# Patient Record
Sex: Male | Born: 1995 | Race: Black or African American | Hispanic: No | Marital: Single | Smoking: Never smoker
Health system: Southern US, Community
[De-identification: ages and names within clinical notes are randomized; demographics above are authoritative.]

## PROBLEM LIST (undated history)

## (undated) ENCOUNTER — Ambulatory Visit: Payer: Self-pay | Source: Home / Self Care

---
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
## 2018-01-08 ENCOUNTER — Other Ambulatory Visit: Payer: Self-pay

## 2018-01-08 ENCOUNTER — Ambulatory Visit (INDEPENDENT_AMBULATORY_CARE_PROVIDER_SITE_OTHER): Payer: Self-pay

## 2018-01-08 ENCOUNTER — Encounter (HOSPITAL_COMMUNITY): Payer: Self-pay | Admitting: Emergency Medicine

## 2018-01-08 ENCOUNTER — Ambulatory Visit (HOSPITAL_COMMUNITY)
Admission: EM | Admit: 2018-01-08 | Discharge: 2018-01-08 | Disposition: A | Discharge status: Home / Self Care | Payer: Self-pay | Attending: Family Medicine | Admitting: Family Medicine

## 2018-01-08 ENCOUNTER — Emergency Department (HOSPITAL_COMMUNITY)
Admission: EM | Admit: 2018-01-08 | Discharge: 2018-01-08 | Disposition: A | Discharge status: Home / Self Care | Payer: Self-pay

## 2018-01-08 DIAGNOSIS — M79644 Pain in right finger(s): Secondary | ICD-10-CM

## 2018-01-08 MED ORDER — MELOXICAM 7.5 MG PO TABS: 7.5000 mg | ORAL_TABLET | Freq: Every day | ORAL | 0 refills | Status: DC

## 2018-01-08 NOTE — Discharge Instructions (Signed)
Xray negative for fracture or dislocation. Given no redness/increased warmth, less worried about infection. Start mobic as directed, ice compress, elevation. Monitor for any spreading redness, increased warmth, fever, follow up for reevaluation.

## 2018-01-08 NOTE — ED Triage Notes (Signed)
Pt with swelling and pain to right middle finger

## 2018-01-08 NOTE — ED Provider Notes (Signed)
[facility]    CSN: [id number] Arrival date & time: 01/08/18  1250     History   Chief Complaint Chief Complaint  Patient presents with  . Finger Injury    HPI Charles Hensley is a 22 y.o. male.   22 year old male comes in for 4-day history of right middle finger swelling and pain.  Patient states he woke up with the pain, no specific injury.  Pain at the DIP.  No erythema, increased warmth.  Denies fever, chills, night sweats.  Denies open wound.  Has been doing warm compresses with temporary improvement.  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     History reviewed. No pertinent past medical history.  There are no active problems to display for this patient.   History reviewed. No pertinent surgical history.     Home Medications    Prior to Admission medications   Medication Sig Start Date End Date Taking? Authorizing Provider  Aspirin-Acetaminophen-Caffeine (GOODY HEADACHE PO) Take 1 packet by mouth daily as needed (for pain.).    [provider]  clotrimazole (LOTRIMIN) 1 % cream Apply to affected area 3 times daily.  Use for 2 days after resolution of rash. 02/17/13   Lowanda Foster, NP  meloxicam (MOBIC) 7.5 MG tablet Take 1 tablet (7.5 mg total) by mouth daily. 01/08/18   Belinda Fisher, PA-C    Family History History reviewed. No pertinent family history.  Social History Social History   Tobacco Use  . Smoking status: Not on file  Substance Use Topics  . Alcohol use: Not on file  . Drug use: Not on file     Allergies   Patient has no known allergies.   Review of Systems Review of Systems  Reason unable to perform ROS: See HPI as above.     Physical Exam Triage Vital Signs ED Triage Vitals [01/08/18 1327]  Enc Vitals Group     BP 138/79     Pulse Rate (!) 55     Resp 18     Temp 98 F (36.7 C)     Temp Source Oral     SpO2 95 %     Weight      Height      Head  Circumference      Peak Flow      Pain Score      Pain Loc      Pain Edu?      Excl. in GC?    No data found.  Updated Vital Signs BP 138/79 (BP Location: Left Arm)   Pulse (!) 55   Temp 98 F (36.7 C) (Oral)   Resp 18   SpO2 95%   Physical Exam  Constitutional: He is oriented to person, place, and time. He appears well-developed and well-nourished. No distress.  HENT:  Head: Normocephalic and atraumatic.  Eyes: Pupils are equal, round, and reactive to light. Conjunctivae are normal.  Musculoskeletal:  See picture below.  Mild swelling to the right middle finger pad without erythema or increased warmth.  Tenderness to palpation of the right middle  DIP.  Full range of motion.  Sensation intact and equal bilaterally. Radial pulse 2+. Cap refill less than 2 seconds.  Neurological: He is alert and oriented to person, place, and time.       UC Treatments / Results  Labs (all labs ordered are listed, but only abnormal results are displayed) Labs Reviewed -  No data to display  EKG None Radiology Dg Finger Middle Right  Result Date: 01/08/2018 CLINICAL DATA:  Pain and swelling of the right middle finger beginning 4 days ago. EXAM: RIGHT MIDDLE FINGER 2+V COMPARISON:  None. FINDINGS: There is no evidence of fracture or dislocation. There is no evidence of arthropathy or other focal bone abnormality. Soft tissues are unremarkable. IMPRESSION: Negative. Electronically Signed   By: Marnee SpringJonathon  Watts M.D.   On: 01/08/2018 14:18    Procedures Procedures (including critical care time)  Medications Ordered in UC Medications - No data to display   Initial Impression / Assessment and Plan / UC Course  I have reviewed the triage vital signs and the nursing notes.  Pertinent labs & imaging results that were available during my care of the patient were reviewed by me and considered in my medical decision making (see chart for details).    Xray negative. Given patient without  erythema, increased warmth, low suspicion of felon/paronychia at this time. Suspect inflammatory cause of symptoms. Will start mobic, ice compress, elevation. Return precautions given. Patient expresses understanding and agrees to plan.  Case discussed with Dr Tracie HarrierHagler, who agrees to plan.   Final Clinical Impressions(s) / UC Diagnoses   Final diagnoses:  Pain of right middle finger    ED Discharge Orders        Ordered    meloxicam (MOBIC) 7.5 MG tablet  Daily     01/08/18 1430       Belinda FisherYu, Amy V, [place] JerseyPA-C 01/08/18 1549

## 2018-01-10 ENCOUNTER — Emergency Department (HOSPITAL_COMMUNITY)
Admission: EM | Admit: 2018-01-10 | Discharge: 2018-01-10 | Disposition: A | Discharge status: Home / Self Care | Payer: Self-pay | Attending: Physician Assistant | Admitting: Physician Assistant

## 2018-01-10 ENCOUNTER — Encounter (HOSPITAL_COMMUNITY): Payer: Self-pay

## 2018-01-10 DIAGNOSIS — L03011 Cellulitis of right finger: Secondary | ICD-10-CM | POA: Insufficient documentation

## 2018-01-10 MED ORDER — HYDROCODONE-ACETAMINOPHEN 5-325 MG PO TABS: 1.0000 | ORAL_TABLET | Freq: Four times a day (QID) | ORAL | 0 refills | Status: DC | PRN

## 2018-01-10 MED ORDER — CEPHALEXIN 500 MG PO CAPS: 500.0000 mg | ORAL_CAPSULE | Freq: Four times a day (QID) | ORAL | 0 refills | Status: AC

## 2018-01-10 MED ORDER — LIDOCAINE HCL (PF) 1 % IJ SOLN
10.0000 mL | Freq: Once | INTRAMUSCULAR | Status: AC
  Administered 2018-01-10: 10 mL
  Filled 2018-01-10: qty 10

## 2018-01-10 NOTE — ED Provider Notes (Signed)
[facility] EMERGENCY DEPARTMENT Provider Note   CSN: [id number] Arrival date & time: 01/10/18  2106     History   Chief Complaint Chief Complaint  Patient presents with  . Hand Pain    HPI Charles Hensley is a 22 y.o. male who is previously healthy who presents with a 1 week history of R middle finger pain.  He reports going to urgent care 2 days ago and had a negative x-ray.  He was told to keep an eye on it and that there could be an infection developing.  He denies any known injury.  He has had some tingling.  The pain is worse in the tip.  He has had progressive pain and swelling over the past few days.  He denies any fevers.  There is no open wound.  He denies any drainage.  He has been taking ibuprofen 800 mg at home without relief.  HPI  History reviewed. No pertinent past medical history.  There are no active problems to display for this patient.   History reviewed. No pertinent surgical history.      Home Medications    Prior to Admission medications   Medication Sig Start Date End Date Taking? Authorizing Provider  Aspirin-Acetaminophen-Caffeine (GOODY HEADACHE PO) Take 1 packet by mouth daily as needed (for pain.).    [provider]  cephALEXin (KEFLEX) 500 MG capsule Take 1 capsule (500 mg total) by mouth 4 (four) times daily for 7 days. 01/10/18 01/17/18  Porshia Blizzard, Waylan Boga, PA-C  clotrimazole (LOTRIMIN) 1 % cream Apply to affected area 3 times daily.  Use for 2 days after resolution of rash. 02/17/13   Lowanda Foster, NP  HYDROcodone-acetaminophen (NORCO/VICODIN) 5-325 MG tablet Take 1-2 tablets by mouth every 6 (six) hours as needed for severe pain. 01/10/18   Ziggy Chanthavong, Waylan Boga, PA-C  meloxicam (MOBIC) 7.5 MG tablet Take 1 tablet (7.5 mg total) by mouth daily. 01/08/18   Belinda Fisher, PA-C    Family History No family history on file.  Social History Social History   Tobacco Use  . Smoking status: Not on file  Substance Use Topics  .  Alcohol use: Not on file  . Drug use: Not on file     Allergies   Patient has no known allergies.   Review of Systems Review of Systems  Constitutional: Negative for fever.  Musculoskeletal: Positive for joint swelling (Tip of R middle finger).  Neurological: Positive for numbness (paresthesia).     Physical Exam Updated Vital Signs BP 133/72 (BP Location: Right Arm)   Pulse 74   Temp 99 F (37.2 C) (Oral)   Resp 13   Ht 5\' 10"  (1.778 m)   Wt 95.3 kg (210 lb)   SpO2 98%   BMI 30.13 kg/m   Physical Exam  Constitutional: He appears well-developed and well-nourished. No distress.  HENT:  Head: Normocephalic and atraumatic.  Eyes: Pupils are equal, round, and reactive to light. Conjunctivae are normal. Right eye exhibits no discharge. Left eye exhibits no discharge. No scleral icterus.  Neck: Normal range of motion. Neck supple. No thyromegaly present.  Cardiovascular: Normal rate, regular rhythm and normal heart sounds. Exam reveals no gallop and no friction rub.  No murmur heard. Pulmonary/Chest: Effort normal and breath sounds normal. No stridor. No respiratory distress. He has no wheezes. He has no rales.  Abdominal: Soft. Bowel sounds are normal. He exhibits no distension. There is no tenderness. There is no rebound and no  guarding.  Musculoskeletal: He exhibits no edema.  Edema noted to palmar aspect distal to the DIP on right long finger, significant tenderness, some induration, sensation intact; range of motion intact of the digit, no tenderness of PIP or DIP, no tenderness, edema, or drainage noted to the eponychial space  Lymphadenopathy:    He has no cervical adenopathy.  Neurological: He is alert. Coordination normal.  Skin: Skin is warm and dry. No rash noted. He is not diaphoretic. No pallor.  Psychiatric: He has a normal mood and affect.  Nursing note and vitals reviewed.        ED Treatments / Results  Labs (all labs ordered are listed, but only  abnormal results are displayed) Labs Reviewed - No data to display  EKG None  Radiology No results found.  Procedures .Marland KitchenIncision and Drainage Date/Time: 01/11/2018 12:55 AM Performed by: Emi Holes, PA-C Authorized by: Emi Holes, PA-C   Consent:    Consent obtained:  Verbal   Consent given by:  Patient   Risks discussed:  Bleeding and incomplete drainage Location:    Indications for incision and drainage: felon.   Location:  Upper extremity   Upper extremity location:  Finger   Finger location:  R long finger Pre-procedure details:    Skin preparation:  Chloraprep Anesthesia (see MAR for exact dosages):    Anesthesia method:  Nerve block   Block location:  Digital   Block needle gauge:  25 G   Block anesthetic:  Lidocaine 1% w/o epi   Block technique:  Intrathecal   Block injection procedure:  Anatomic landmarks identified, introduced needle, negative aspiration for blood and incremental injection   Block outcome:  Anesthesia achieved Procedure type:    Complexity:  Simple Procedure details:    Incision types:  Single straight   Incision depth:  Subcutaneous   Scalpel blade:  11   Wound management:  Probed and deloculated   Drainage:  Bloody   Drainage amount:  Moderate   Wound treatment:  Wound left open   Packing materials:  None Post-procedure details:    Patient tolerance of procedure:  Tolerated well, no immediate complications Comments:     Under the supervision of my attending, Dr. Corlis Leak.   (including critical care time)  Medications Ordered in ED Medications  lidocaine (PF) (XYLOCAINE) 1 % injection 10 mL (10 mLs Infiltration Given 01/10/18 2227)     Initial Impression / Assessment and Plan / ED Course  I have reviewed the triage vital signs and the nursing notes.  Pertinent labs & imaging results that were available during my care of the patient were reviewed by me and considered in my medical decision making (see chart for  details).     Patient with suspected felon to right long finger.  Incision and drainage conducted under the supervision of my attending, Dr. Corlis Leak, without purulent drainage.  Pressure dressing applied.  Will discharge home with Keflex, short course of pain medication.  I reviewed the [place] narcotic database and found no discrepancies.  Patient to follow-up with hand surgery for further management.  Return precautions discussed.  Patient understands and agrees with plan.  Patient vitals stable throughout ED course and discharged in satisfactory condition.  Patient also evaluated by Dr. Corlis Leak who guided the patient's management and agrees with plan.  Final Clinical Impressions(s) / ED Diagnoses   Final diagnoses:  Felon of finger of right hand    ED Discharge Orders  Ordered    cephALEXin (KEFLEX) 500 MG capsule  4 times daily     01/10/18 2311    HYDROcodone-acetaminophen (NORCO/VICODIN) 5-325 MG tablet  Every 6 hours PRN     01/10/18 2313       Emi HolesLaw, Morgyn Marut M, PA-C 01/11/18 0058    Abelino DerrickMackuen, Courteney Lyn, MD 01/13/18 1300

## 2018-01-10 NOTE — Discharge Instructions (Addendum)
Take Keflex as prescribed until completed for told to stop by hand doctor, Dr. Melvyn Novasrtmann.  Take ibuprofen as prescribed over-the-counter as needed for pain.  For severe pain, take 1-2 Norco every 4-6 hours as needed.  Please follow-up with Dr. Melvyn Novasrtmann in the next few days for recheck and further evaluation and treatment of your finger.  Please return to emergency department if you develop any new or worsening symptoms including increasing pain, redness, swelling, red streaking moving up your finger and hand, or fevers.  Do not drink alcohol, drive, operate machinery or participate in any other potentially dangerous activities while taking opiate pain medication as it may make you sleepy. Do not take this medication with any other sedating medications, either prescription or over-the-counter. If you were prescribed Percocet or Vicodin, do not take these with acetaminophen (Tylenol) as it is already contained within these medications and overdose of Tylenol is dangerous.   This medication is an opiate (or narcotic) pain medication and can be habit forming.  Use it as little as possible to achieve adequate pain control.  Do not use or use it with extreme caution if you have a history of opiate abuse or dependence. This medication is intended for your use only - do not give any to anyone else and keep it in a secure place where nobody else, especially children, have access to it. It will also cause or worsen constipation, so you may want to consider taking an over-the-counter stool softener while you are taking this medication.

## 2018-01-10 NOTE — ED Triage Notes (Signed)
Reports swelling to R middle finger for the past week, denies injury, [facility]. reports worse swelling at the tip

## 2019-04-17 ENCOUNTER — Ambulatory Visit (HOSPITAL_COMMUNITY)
Admission: EM | Admit: 2019-04-17 | Discharge: 2019-04-17 | Disposition: A | Discharge status: Home / Self Care | Payer: Self-pay | Attending: Family Medicine | Admitting: Family Medicine

## 2019-04-17 ENCOUNTER — Encounter (HOSPITAL_COMMUNITY): Payer: Self-pay | Admitting: Emergency Medicine

## 2019-04-17 ENCOUNTER — Ambulatory Visit (INDEPENDENT_AMBULATORY_CARE_PROVIDER_SITE_OTHER): Payer: Self-pay

## 2019-04-17 ENCOUNTER — Other Ambulatory Visit: Payer: Self-pay

## 2019-04-17 DIAGNOSIS — S93402A Sprain of unspecified ligament of left ankle, initial encounter: Secondary | ICD-10-CM

## 2019-04-17 DIAGNOSIS — M25572 Pain in left ankle and joints of left foot: Secondary | ICD-10-CM

## 2019-04-17 DIAGNOSIS — X501XXA Overexertion from prolonged static or awkward postures, initial encounter: Secondary | ICD-10-CM

## 2019-04-17 DIAGNOSIS — Y9367 Activity, basketball: Secondary | ICD-10-CM

## 2019-04-17 MED ORDER — IBUPROFEN 800 MG PO TABS: 800.0000 mg | ORAL_TABLET | Freq: Three times a day (TID) | ORAL | 0 refills | Status: DC

## 2019-04-17 NOTE — ED Triage Notes (Signed)
Pt states on Monday he was playing basketball and he rolled his L ankle. Pt states "I just wanted an xray just to be sure nothing is broken". Pt states he heard a pop. Ambulatory with steady gait.

## 2019-04-17 NOTE — ED Provider Notes (Signed)
[facility]    CSN: [id number] Arrival date & time: 04/17/19  1143     History   Chief Complaint Chief Complaint  Patient presents with   Ankle Pain    HPI Oswaldo Cueto is a 23 y.o. male no significant past medical history presenting today for evaluation of left ankle injury.  Patient was playing basketball on Monday and twisted his left ankle.  Since he has had pain and swelling.  Pain has gradually improved since then over the past 2 to 3 days.  He is here today as he wishes to get an x-ray as he felt a pop when it happened and wants to make sure everything looks okay.  Overall minimal pain today.  Denies numbness or tingling.  Denies previous injury to this foot besides previous sprains.  HPI  History reviewed. No pertinent past medical history.  There are no active problems to display for this patient.   History reviewed. No pertinent surgical history.     Home Medications    Prior to Admission medications   Medication Sig Start Date End Date Taking? Authorizing Provider  ibuprofen (ADVIL) 800 MG tablet Take 1 tablet (800 mg total) by mouth 3 (three) times daily. 04/17/19   Homar Weinkauf, Elesa Hacker, PA-C    Family History No family history on file.  Social History Social History   Tobacco Use   Smoking status: Not on file  Substance Use Topics   Alcohol use: Not on file   Drug use: Not on file     Allergies   Patient has no known allergies.   Review of Systems Review of Systems  Constitutional: Negative for fatigue and fever.  Eyes: Negative for redness, itching and visual disturbance.  Respiratory: Negative for shortness of breath.   Cardiovascular: Negative for chest pain and leg swelling.  Gastrointestinal: Negative for nausea and vomiting.  Musculoskeletal: Positive for arthralgias. Negative for myalgias.  Skin: Negative for color change, rash and wound.  Neurological: Negative for dizziness, syncope, weakness, light-headedness and  headaches.     Physical Exam Triage Vital Signs ED Triage Vitals  Enc Vitals Group     BP 04/17/19 1314 (!) 119/55     Pulse Rate 04/17/19 1314 (!) 48     Resp 04/17/19 1314 16     Temp 04/17/19 1314 98.2 F (36.8 C)     Temp src --      SpO2 04/17/19 1314 100 %     Weight --      Height --      Head Circumference --      Peak Flow --      Pain Score 04/17/19 1316 0     Pain Loc --      Pain Edu? --      Excl. in [place]? --    No data found.  Updated Vital Signs BP (!) 119/55    Pulse (!) 48    Temp 98.2 F (36.8 C)    Resp 16    SpO2 100%   Visual Acuity Right Eye Distance:   Left Eye Distance:   Bilateral Distance:    Right Eye Near:   Left Eye Near:    Bilateral Near:     Physical Exam Vitals signs and nursing note reviewed.  Constitutional:      Appearance: He is well-developed.     Comments: No acute distress  HENT:     Head: Normocephalic and atraumatic.     Nose: Nose  normal.  Eyes:     Conjunctiva/sclera: Conjunctivae normal.  Neck:     Musculoskeletal: Neck supple.  Cardiovascular:     Rate and Rhythm: Normal rate.  Pulmonary:     Effort: Pulmonary effort is normal. No respiratory distress.  Abdominal:     General: There is no distension.  Musculoskeletal: Normal range of motion.     Comments: Left ankle: No obvious swelling deformity or discoloration, nontender to palpation over medial lateral malleolus, nontender throughout dorsum of foot Dorsalis pedis 2+, cap refill less than 2 seconds Able to wiggle toes  Skin:    General: Skin is warm and dry.  Neurological:     Mental Status: He is alert and oriented to person, place, and time.      UC Treatments / Results  Labs (all labs ordered are listed, but only abnormal results are displayed) Labs Reviewed - No data to display  EKG   Radiology Dg Ankle Complete Left  Result Date: 04/17/2019 CLINICAL DATA:  Left ankle pain and swelling, twisting injury. EXAM: LEFT ANKLE COMPLETE - 3+  VIEW COMPARISON:  None. FINDINGS: No acute fracture or malalignment. Ankle mortise is congruent. Joint spaces are maintained. Corticated ossific density at the dorsal aspect of the talonavicular joint, likely sequela of remote trauma. Tiny enthesophyte at the Achilles tendon insertion. Mild prominence of the soft tissues overlying the lateral malleolus. IMPRESSION: No acute osseous abnormality, left ankle. Electronically Signed   By: Duanne GuessNicholas  Plundo M.D.   On: 04/17/2019 14:07    Procedures Procedures (including critical care time)  Medications Ordered in UC Medications - No data to display  Initial Impression / Assessment and Plan / UC Course  I have reviewed the triage vital signs and the nursing notes.  Pertinent labs & imaging results that were available during my care of the patient were reviewed by me and considered in my medical decision making (see chart for details).     Obtained x-ray per patient request.  X-ray negative.  Most likely sprain.  Will apply Ace wrap, treat with anti-inflammatories.  Continue to monitor for gradual resolution.Discussed strict return precautions. Patient verbalized understanding and is agreeable with plan.  Final Clinical Impressions(s) / UC Diagnoses   Final diagnoses:  Sprain of left ankle, unspecified ligament, initial encounter     Discharge Instructions     Use anti-inflammatories for pain/swelling. You may take up to 800 mg Ibuprofen every 8 hours with food. You may supplement Ibuprofen with Tylenol [phone] mg every 8 hours.      ED Prescriptions    Medication Sig Dispense Auth. Provider   ibuprofen (ADVIL) 800 MG tablet Take 1 tablet (800 mg total) by mouth 3 (three) times daily. 21 tablet Kamar Callender, AssariaHallie C, PA-C     Controlled Substance Prescriptions [place] Controlled Substance Registry consulted? Not Applicable   Lew DawesWieters, Siriyah Ambrosius C, [place] JerseyPA-C 04/17/19 1445

## 2019-04-17 NOTE — Discharge Instructions (Signed)
Use anti-inflammatories for pain/swelling. You may take up to 800 mg Ibuprofen every 8 hours with food. You may supplement Ibuprofen with Tylenol 500-1000 mg every 8 hours.   

## 2019-12-22 IMAGING — DX DG FINGER MIDDLE 2+V*R*
3 series · 3 of 3 positions shown · non-contrast
Comparison: None.

CLINICAL DATA: Pain and swelling of the right middle finger
beginning 4 days ago.

EXAM:
RIGHT MIDDLE FINGER 2+V

[finger ap]
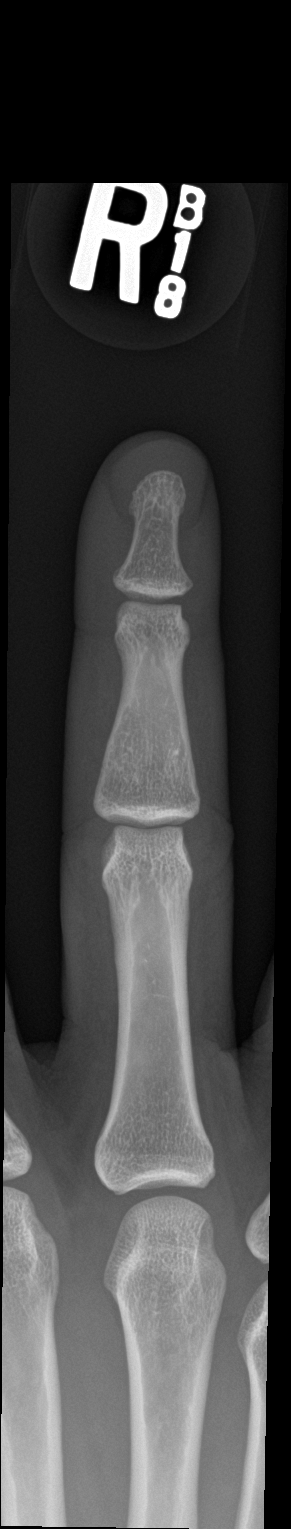

[finger obl]
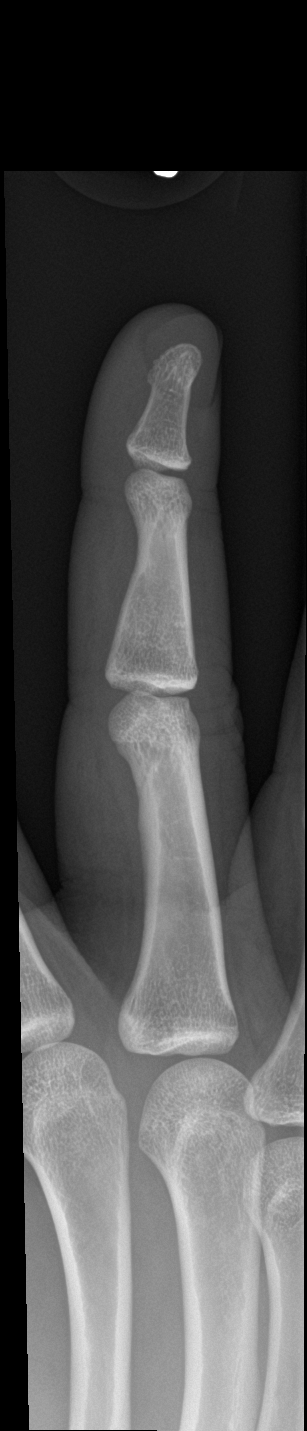

[finger lat]
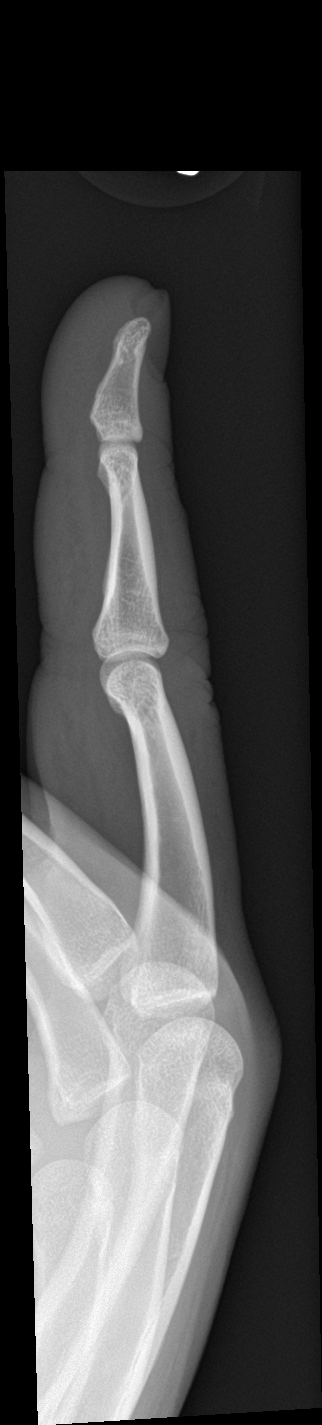

[3 of 3 positions shown; findings below may reference images not displayed]

FINDINGS: There is no evidence of fracture or dislocation. There is no
evidence of arthropathy or other focal bone abnormality. Soft
tissues are unremarkable.
IMPRESSION: Negative.

## 2021-09-15 ENCOUNTER — Encounter: Payer: Self-pay | Admitting: Emergency Medicine

## 2021-09-15 ENCOUNTER — Other Ambulatory Visit: Payer: Self-pay

## 2021-09-15 ENCOUNTER — Ambulatory Visit
Admission: EM | Admit: 2021-09-15 | Discharge: 2021-09-15 | Disposition: A | Discharge status: Home / Self Care | Payer: Self-pay | Attending: Physician Assistant | Admitting: Physician Assistant

## 2021-09-15 DIAGNOSIS — L0291 Cutaneous abscess, unspecified: Secondary | ICD-10-CM

## 2021-09-15 MED ORDER — DOXYCYCLINE HYCLATE 100 MG PO CAPS: 100.0000 mg | ORAL_CAPSULE | Freq: Two times a day (BID) | ORAL | 0 refills | Status: DC

## 2021-09-15 NOTE — ED Provider Notes (Signed)
[facility]    CSN: [id number] Arrival date & time: 09/15/21  1123      History   Chief Complaint Chief Complaint  Patient presents with   Abscess    HPI Charles Hensley is a 25 y.o. male.   Patient is here today for evaluation of abscess to his lower back that he first noticed about a week and a half ago.  He reports that area is tender to touch.  He states a few days ago he did notice some bloody purulent drainage, he cleaned the area with peroxide and kept it bandaged.  He has not had fever or chills.  He denies any nausea or vomiting.  The history is provided by the patient.  Abscess Associated symptoms: no fever, no nausea and no vomiting    History reviewed. No pertinent past medical history.  There are no problems to display for this patient.   History reviewed. No pertinent surgical history.     Home Medications    Prior to Admission medications   Medication Sig Start Date End Date Taking? Authorizing Provider  doxycycline (VIBRAMYCIN) 100 MG capsule Take 1 capsule (100 mg total) by mouth 2 (two) times daily. 09/15/21  Yes Tomi Bamberger, PA-C  ibuprofen (ADVIL) 800 MG tablet Take 1 tablet (800 mg total) by mouth 3 (three) times daily. 04/17/19   Wieters, Junius Creamer, PA-C    Family History History reviewed. No pertinent family history.  Social History Social History   Tobacco Use   Smoking status: Unknown     Allergies   Patient has no known allergies.   Review of Systems Review of Systems  Constitutional:  Negative for chills and fever.  Eyes:  Negative for discharge and redness.  Respiratory:  Negative for shortness of breath.   Gastrointestinal:  Negative for nausea and vomiting.  Skin:  Positive for color change and wound.    Physical Exam Triage Vital Signs ED Triage Vitals  Enc Vitals Group     BP 09/15/21 1148 115/67     Pulse Rate 09/15/21 1148 61     Resp 09/15/21 1148 16     Temp 09/15/21 1148 98.2 F (36.8 C)      Temp Source 09/15/21 1148 Oral     SpO2 09/15/21 1148 99 %     Weight --      Height --      Head Circumference --      Peak Flow --      Pain Score 09/15/21 1149 7     Pain Loc --      Pain Edu? --      Excl. in [place]? --    No data found.  Updated Vital Signs BP 115/67 (BP Location: Left Arm)    Pulse 61    Temp 98.2 F (36.8 C) (Oral)    Resp 16    SpO2 99%       Physical Exam Vitals and nursing note reviewed.  Constitutional:      General: He is not in acute distress.    Appearance: Normal appearance. He is not ill-appearing, toxic-appearing or diaphoretic.  HENT:     Head: Normocephalic and atraumatic.  Eyes:     Conjunctiva/sclera: Conjunctivae normal.  Cardiovascular:     Rate and Rhythm: Normal rate.  Pulmonary:     Effort: Pulmonary effort is normal.  Skin:    Comments: Approximately 4 cm area of induration noted to right sided lower back with mild  erythema associated.  No fluctuance noted.  Central wound noted with no active drainage or bleeding.  Neurological:     Mental Status: He is alert.  Psychiatric:        Mood and Affect: Mood normal.        Behavior: Behavior normal.     UC Treatments / Results  Labs (all labs ordered are listed, but only abnormal results are displayed) Labs Reviewed - No data to display  EKG   Radiology No results found.  Procedures Procedures (including critical care time)  Medications Ordered in UC Medications - No data to display  Initial Impression / Assessment and Plan / UC Course  I have reviewed the triage vital signs and the nursing notes.  Pertinent labs & imaging results that were available during my care of the patient were reviewed by me and considered in my medical decision making (see chart for details).    Doxycycline prescribed for suspected abscess and recommended warm compresses to help promote continued spontaneous drainage.  No indication for I&D today given drainage has already started without  intervention.  Encouraged follow-up if symptoms do not improve or if they worsen in any way.  Final Clinical Impressions(s) / UC Diagnoses   Final diagnoses:  Abscess   Discharge Instructions   None    ED Prescriptions     Medication Sig Dispense Auth. Provider   doxycycline (VIBRAMYCIN) 100 MG capsule Take 1 capsule (100 mg total) by mouth 2 (two) times daily. 20 capsule Tomi Bamberger, PA-C      PDMP not reviewed this encounter.   Tomi Bamberger, PA-C 09/15/21 1226

## 2021-09-15 NOTE — ED Triage Notes (Signed)
Abscess on lower back, swollen, red, tender x 1.5 weeks. States he works out a lot and it rubs against the seat. Hx of abscess under arm that went away on its own.

## 2024-02-20 ENCOUNTER — Ambulatory Visit
Admission: EM | Admit: 2024-02-20 | Discharge: 2024-02-20 | Disposition: A | Discharge status: Home / Self Care | Payer: Self-pay | Attending: Family Medicine | Admitting: Family Medicine

## 2024-02-20 DIAGNOSIS — L03031 Cellulitis of right toe: Secondary | ICD-10-CM

## 2024-02-20 MED ORDER — CEPHALEXIN 500 MG PO CAPS: 500.0000 mg | ORAL_CAPSULE | Freq: Three times a day (TID) | ORAL | 0 refills | Status: AC

## 2024-02-20 NOTE — ED Provider Notes (Signed)
 [facility]    CSN: [id number] Arrival date & time: 02/20/24  1253      History   Chief Complaint Chief Complaint  Patient presents with   Toe Pain    HPI Charles Hensley is a 28 y.o. male.    Toe Pain  Right big toe pain.  Is been bothering him for about 2 days.  It is a little swollen and red.  No known injury.   NKDA  History reviewed. No pertinent past medical history.  There are no active problems to display for this patient.   History reviewed. No pertinent surgical history.     Home Medications    Prior to Admission medications   Medication Sig Start Date End Date Taking? Authorizing Provider  cephALEXin  (KEFLEX ) 500 MG capsule Take 1 capsule (500 mg total) by mouth 3 (three) times daily for 7 days. 02/20/24 02/27/24 Yes Ann Keto, MD    Family History History reviewed. No pertinent family history.  Social History Social History   Tobacco Use   Smoking status: Never   Smokeless tobacco: Never  Vaping Use   Vaping status: Never Used  Substance Use Topics   Alcohol use: Not Currently   Drug use: Not Currently     Allergies   Patient has no known allergies.   Review of Systems Review of Systems   Physical Exam Triage Vital Signs ED Triage Vitals  Encounter Vitals Group     BP 02/20/24 1307 105/65     Systolic BP Percentile --      Diastolic BP Percentile --      Pulse Rate 02/20/24 1307 63     Resp 02/20/24 1307 18     Temp 02/20/24 1307 97.9 F (36.6 C)     Temp Source 02/20/24 1307 Oral     SpO2 02/20/24 1307 97 %     Weight 02/20/24 1305 187 lb (84.8 kg)     Height 02/20/24 1305 5\' 10"  (1.778 m)     Head Circumference --      Peak Flow --      Pain Score 02/20/24 1301 7     Pain Loc --      Pain Education --      Exclude from Growth Chart --    No data found.  Updated Vital Signs BP 105/65 (BP Location: Left Arm)   Pulse 63   Temp 97.9 F (36.6 C) (Oral)   Resp 18   Ht 5\' 10"  (1.778 m)    Wt 84.8 kg   SpO2 97%   BMI 26.83 kg/m   Visual Acuity Right Eye Distance:   Left Eye Distance:   Bilateral Distance:    Right Eye Near:   Left Eye Near:    Bilateral Near:     Physical Exam Vitals reviewed.  Constitutional:      General: He is not in acute distress.    Appearance: He is not toxic-appearing or diaphoretic.  Musculoskeletal:     Comments: The right great toe has a little bit of induration and erythema (about 4 mm in width) adjacent to the proximal nail fold.  There is no swelling or erythema of the medial or lateral nail fold on that toe.  There is no discharge.  Skin:    Coloration: Skin is not jaundiced or pale.  Neurological:     General: No focal deficit present.     Mental Status: He is alert and oriented to person, place, and  time.  Psychiatric:        Behavior: Behavior normal.      UC Treatments / Results  Labs (all labs ordered are listed, but only abnormal results are displayed) Labs Reviewed - No data to display  EKG   Radiology No results found.  Procedures Procedures (including critical care time)  Medications Ordered in UC Medications - No data to display  Initial Impression / Assessment and Plan / UC Course  I have reviewed the triage vital signs and the nursing notes.  Pertinent labs & imaging results that were available during my care of the patient were reviewed by me and considered in my medical decision making (see chart for details).     Keflex  is sent in for the cellulitis around his nail on the great toe.  He states Advil  has been helping his pain so he will continue taking that as needed. Final Clinical Impressions(s) / UC Diagnoses   Final diagnoses:  Cellulitis of toe of right foot     Discharge Instructions      Take cephalexin  500 mg--1 capsule 3 times daily for 7 days  Warm soaks can help circulation in the sore area and help healing.  Continue Advil  as needed for the pain.    ED Prescriptions      Medication Sig Dispense Auth. Provider   cephALEXin  (KEFLEX ) 500 MG capsule Take 1 capsule (500 mg total) by mouth 3 (three) times daily for 7 days. 21 capsule Chika Cichowski K, MD      PDMP not reviewed this encounter.   Ann Keto, MD 02/20/24 [id number]

## 2024-02-20 NOTE — ED Triage Notes (Signed)
"  I am having right great toe pain, throbbing the last 2 days, no injury know, ? Ingrown nail or injury unknown". No fever.

## 2024-02-20 NOTE — Discharge Instructions (Signed)
 Take cephalexin  500 mg--1 capsule 3 times daily for 7 days  Warm soaks can help circulation in the sore area and help healing.  Continue Advil  as needed for the pain.

## 2024-03-12 ENCOUNTER — Ambulatory Visit: Payer: Self-pay

## 2024-03-13 ENCOUNTER — Other Ambulatory Visit: Payer: Self-pay

## 2024-03-13 ENCOUNTER — Ambulatory Visit
Admission: RE | Admit: 2024-03-13 | Discharge: 2024-03-13 | Disposition: A | Discharge status: Home / Self Care | Payer: Self-pay | Source: Ambulatory Visit | Attending: Family Medicine | Admitting: Family Medicine

## 2024-03-13 VITALS — BP 118/71 | HR 50 | Temp 97.8°F | Resp 16

## 2024-03-13 DIAGNOSIS — L608 Other nail disorders: Secondary | ICD-10-CM

## 2024-03-13 MED ORDER — DOXYCYCLINE HYCLATE 100 MG PO CAPS: 100.0000 mg | ORAL_CAPSULE | Freq: Two times a day (BID) | ORAL | 0 refills | Status: AC

## 2024-03-13 NOTE — ED Triage Notes (Signed)
 Pt st's he was seen here a few weeks ago for right great toe pain.  Pt st's pain has subsided but now nail is discolored and starting to lift up.

## 2024-03-13 NOTE — ED Provider Notes (Signed)
 [facility]   [id number] 03/13/24 Arrival Time: 1203  ASSESSMENT & PLAN:  1. Acquired deformity of toenail    Referral to podiatry placed. Orders Placed This Encounter  Procedures   Ambulatory referral to Podiatry    Referral Priority:   Routine    Referral Type:   Consultation    Referral Reason:   Specialty Services Required    Requested Specialty:   Podiatry    Number of Visits Requested:   1   Nail trephination performed with cautery pen to ensure no bleeding beneath nail; without drainage or bleeding. With description of pus coming from under his toenail will start: Discharge Medication List as of 03/13/2024 12:45 PM     START taking these medications   Details  doxycycline  (VIBRAMYCIN ) 100 MG capsule Take 1 capsule (100 mg total) by mouth 2 (two) times daily., Starting Wed 03/13/2024, Normal       Will defer to podiatry for further treatment including pseudomonas nail infection. No signs of this spreading to soft tissue.  Recommend:  Follow-up Information     Schedule an appointment as soon as possible for a visit  with [facility]).   Contact information: [address] [place],  [place]  [address]  [phone]                Reviewed expectations re: course of current medical issues. Questions answered. Outlined signs and symptoms indicating need for more acute intervention. Patient verbalized understanding. After Visit Summary given.  SUBJECTIVE: History from: patient. Charles Hensley is a 28 y.o. male who reports being seen here a few weeks ago for right great toe pain. Pt st's pain has subsided but now nail is discolored and starting to lift up. Has noted greenish discoloration of nail. Few days ago 'with what looked like pus under my nail'. No active drainage. Denies fever. No tx PTA.  History reviewed. No pertinent surgical history.    OBJECTIVE:  Vitals:   03/13/24 1230  BP: 118/71  Pulse: (!)  50  Resp: 16  Temp: 97.8 F (36.6 C)  TempSrc: Oral  SpO2: 98%    General appearance: alert; no distress Extremities: IRJ:JOACZ toenail dark with greenish tint; has lifted some from nailbed; no pain with manipulation; distal sensation intact; no signs of cellulitis or surrounding skin infection Psychological: alert and cooperative; normal mood and affect  Imaging: No results found.    No Known Allergies  History reviewed. No pertinent past medical history. Social History   Socioeconomic History   Marital status: Single    Spouse name: Not on file   Number of children: Not on file   Years of education: Not on file   Highest education level: Not on file  Occupational History   Not on file  Tobacco Use   Smoking status: Never   Smokeless tobacco: Never  Vaping Use   Vaping status: Never Used  Substance and Sexual Activity   Alcohol use: Not Currently   Drug use: Not Currently   Sexual activity: Not Currently  Other Topics Concern   Not on file  Social History Narrative   Not on file   Social Drivers of Health   Financial Resource Strain: Not on file  Food Insecurity: Not on file  Transportation Needs: Not on file  Physical Activity: Not on file  Stress: Not on file  Social Connections: Not on file   History reviewed. No pertinent family history. History reviewed. No pertinent surgical  history.     Afton Albright, MD 03/13/24 [phone]

## 2024-03-14 ENCOUNTER — Encounter: Payer: Self-pay | Admitting: Podiatry

## 2024-03-14 ENCOUNTER — Ambulatory Visit (INDEPENDENT_AMBULATORY_CARE_PROVIDER_SITE_OTHER): Payer: Self-pay | Admitting: Podiatry

## 2024-03-14 DIAGNOSIS — L03031 Cellulitis of right toe: Secondary | ICD-10-CM

## 2024-03-14 NOTE — Progress Notes (Signed)
  Subjective:  Patient ID: Charles Hensley, male    DOB: 02-May-1996,  MRN: [id number] HPI Chief Complaint  Patient presents with   Toe Pain    Hallux right - redness and swelling at base of nail started 02/20/24, then noticed nail started to turn dark/green, went to Urgent Care yesterday - they drilled a hole in nail-no drainage, Rx'd antibiotics, does look better, but would like the nail taken off   New Patient (Initial Visit)    28 y.o. male presents with the above complaint.   ROS: Denies fever chills nausea vomit muscle aches pains calf pain back pain chest pain shortness of breath.  No past medical history on file. No past surgical history on file.  Current Outpatient Medications:    doxycycline  (VIBRAMYCIN ) 100 MG capsule, Take 1 capsule (100 mg total) by mouth 2 (two) times daily., Disp: 14 capsule, Rfl: 0  No Known Allergies Review of Systems Objective:  There were no vitals filed for this visit.  General: Well developed, nourished, in no acute distress, alert and oriented x3   Dermatological: Skin is warm, dry and supple bilateral. Nails x 10 are well maintained; remaining integument appears unremarkable at this time. There are no open sores, no preulcerative lesions, no rash or signs of infection present.  Hallux nail right is demonstrating distal onycholysis with dark discoloration and green discoloration.  There is no purulence no malodor.  Vascular: Dorsalis Pedis artery and Posterior Tibial artery pedal pulses are 2/4 bilateral with immedate capillary fill time. Pedal hair growth present. No varicosities and no lower extremity edema present bilateral.   Neruologic: Grossly intact via light touch bilateral. Vibratory intact via tuning fork bilateral. Protective threshold with Semmes Wienstein monofilament intact to all pedal sites bilateral. Patellar and Achilles deep tendon reflexes 2+ bilateral. No Babinski or clonus noted bilateral.   Musculoskeletal: No gross boney  pedal deformities bilateral. No pain, crepitus, or limitation noted with foot and ankle range of motion bilateral. Muscular strength 5/5 in all groups tested bilateral.  Gait: Unassisted, Nonantalgic.    Radiographs:  None taken  Assessment & Plan:   Assessment: Nail dystrophy painful nail hallux right  Plan: Nail avulsion today was performed after local anesthetic was administered.  The majority of the nail was loose it was just removed in total.  All necrotic tissue was sharply resected.  He was given both oral and written home-going instruction including soaking instructions he will continue doxycycline  till completed.  Follow-up with me in 2 weeks call with questions or concerns.     Mardi Cannady T. [place], [place]

## 2024-03-14 NOTE — Patient Instructions (Signed)

## 2024-03-28 ENCOUNTER — Ambulatory Visit: Payer: Self-pay | Admitting: Podiatry
